# Patient Record
Sex: Male | Born: 1990 | Race: White | Hispanic: No | Marital: Married | Smoking: Current every day smoker
Health system: Southern US, Community
[De-identification: ages and names within clinical notes are randomized; demographics above are authoritative.]

## PROBLEM LIST (undated history)

## (undated) DIAGNOSIS — I10 Essential (primary) hypertension: Secondary | ICD-10-CM

---
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
## 2009-01-02 ENCOUNTER — Emergency Department: Payer: Self-pay | Admitting: Emergency Medicine

## 2009-06-10 ENCOUNTER — Emergency Department: Payer: Self-pay | Admitting: Emergency Medicine

## 2010-04-10 ENCOUNTER — Emergency Department: Payer: Self-pay | Admitting: Emergency Medicine

## 2010-07-17 ENCOUNTER — Emergency Department: Payer: Self-pay | Admitting: Emergency Medicine

## 2010-07-19 ENCOUNTER — Emergency Department: Payer: Self-pay | Admitting: Emergency Medicine

## 2010-10-17 ENCOUNTER — Emergency Department: Payer: Self-pay | Admitting: Unknown Physician Specialty

## 2010-11-04 ENCOUNTER — Emergency Department: Payer: Self-pay | Admitting: Emergency Medicine

## 2011-02-24 ENCOUNTER — Emergency Department: Payer: Self-pay | Admitting: Emergency Medicine

## 2011-05-22 ENCOUNTER — Emergency Department: Payer: Self-pay | Admitting: Emergency Medicine

## 2011-09-11 ENCOUNTER — Emergency Department: Payer: Self-pay | Admitting: Emergency Medicine

## 2011-10-06 ENCOUNTER — Emergency Department: Payer: Self-pay | Admitting: Emergency Medicine

## 2012-01-08 ENCOUNTER — Emergency Department: Payer: Self-pay | Admitting: Internal Medicine

## 2012-01-13 ENCOUNTER — Emergency Department: Payer: Self-pay | Admitting: Unknown Physician Specialty

## 2012-01-24 ENCOUNTER — Emergency Department: Payer: Self-pay | Admitting: Emergency Medicine

## 2012-01-24 LAB — BASIC METABOLIC PANEL
Anion Gap: 9 (ref 7–16)
Calcium, Total: 9.3 mg/dL (ref 8.5–10.1)
Creatinine: 0.87 mg/dL (ref 0.60–1.30)
EGFR (African American): >60
EGFR (Non-African Amer.): >60
Glucose: 134 mg/dL — ABNORMAL HIGH (ref 65–99)
Sodium: 144 mmol/L (ref 136–145)

## 2012-01-24 LAB — CBC
MCH: 31.4 pg (ref 26.0–34.0)
MCHC: 34.2 g/dL (ref 32.0–36.0)
MCV: 92 fL (ref 80–100)
Platelet: 267 10*3/uL (ref 150–440)
RDW: 12 % (ref 11.5–14.5)

## 2012-01-24 LAB — URINALYSIS, COMPLETE
Ketone: NEGATIVE
Ph: 5 (ref 4.5–8.0)
Protein: 30
RBC,UR: 611 /HPF (ref 0–5)
Squamous Epithelial: NONE SEEN
WBC UR: 3 /HPF (ref 0–5)

## 2012-10-09 ENCOUNTER — Emergency Department: Payer: Self-pay | Admitting: Emergency Medicine

## 2012-10-14 LAB — WOUND CULTURE

## 2012-11-21 ENCOUNTER — Emergency Department: Payer: Self-pay | Admitting: Emergency Medicine

## 2012-11-21 LAB — URINALYSIS, COMPLETE
Bacteria: NONE SEEN
Bilirubin,UR: NEGATIVE
Glucose,UR: NEGATIVE mg/dL (ref 0–75)
Leukocyte Esterase: NEGATIVE
Specific Gravity: 1.029 (ref 1.003–1.030)
Squamous Epithelial: NONE SEEN
WBC UR: 4 /HPF (ref 0–5)

## 2012-11-21 LAB — BASIC METABOLIC PANEL
Anion Gap: 8 (ref 7–16)
BUN: 14 mg/dL (ref 7–18)
Calcium, Total: 9.4 mg/dL (ref 8.5–10.1)
Chloride: 103 mmol/L (ref 98–107)
Co2: 26 mmol/L (ref 21–32)
Creatinine: 0.91 mg/dL (ref 0.60–1.30)
EGFR (African American): >60
Glucose: 158 mg/dL — ABNORMAL HIGH (ref 65–99)
Osmolality: 278 (ref 275–301)
Potassium: 3.4 mmol/L — ABNORMAL LOW (ref 3.5–5.1)

## 2012-11-21 LAB — CBC
HCT: 42.5 % (ref 40.0–52.0)
MCH: 33.2 pg (ref 26.0–34.0)
MCV: 94 fL (ref 80–100)
RDW: 12.8 % (ref 11.5–14.5)
WBC: 12.6 10*3/uL — ABNORMAL HIGH (ref 3.8–10.6)

## 2013-09-02 ENCOUNTER — Emergency Department: Payer: Self-pay | Admitting: Emergency Medicine

## 2013-09-02 LAB — URINALYSIS, COMPLETE
Bilirubin,UR: NEGATIVE
Glucose,UR: NEGATIVE mg/dL (ref 0–75)
Ph: 5 (ref 4.5–8.0)
RBC,UR: 10 /HPF (ref 0–5)
Specific Gravity: 1.027 (ref 1.003–1.030)
Squamous Epithelial: NONE SEEN
WBC UR: 6 /HPF (ref 0–5)

## 2013-09-02 LAB — CBC
HCT: 43.1 % (ref 40.0–52.0)
HGB: 15.2 g/dL (ref 13.0–18.0)
MCH: 32.1 pg (ref 26.0–34.0)
MCV: 91 fL (ref 80–100)
Platelet: 334 10*3/uL (ref 150–440)
RDW: 13.3 % (ref 11.5–14.5)
WBC: 7 10*3/uL (ref 3.8–10.6)

## 2013-09-02 LAB — COMPREHENSIVE METABOLIC PANEL
Albumin: 4.2 g/dL (ref 3.4–5.0)
Alkaline Phosphatase: 76 U/L (ref 50–136)
Anion Gap: 3 — ABNORMAL LOW (ref 7–16)
Calcium, Total: 9.1 mg/dL (ref 8.5–10.1)
Chloride: 115 mmol/L — ABNORMAL HIGH (ref 98–107)
Co2: 22 mmol/L (ref 21–32)
Creatinine: 1.11 mg/dL (ref 0.60–1.30)
EGFR (Non-African Amer.): >60
Glucose: 100 mg/dL — ABNORMAL HIGH (ref 65–99)
Total Protein: 7.6 g/dL (ref 6.4–8.2)

## 2014-04-15 ENCOUNTER — Ambulatory Visit: Payer: Self-pay | Admitting: Urology

## 2016-02-08 IMAGING — CT CT ABDOMEN AND PELVIS WITHOUT AND WITH CONTRAST
2 of 7 series · 13 of 46 positions shown, 18 images · IV contrast (isovue)
Comparison: CT ABD-PELV W/ CM dated 09/02/2013

CLINICAL DATA: micro hematuria, no surg, no cancer, hx of kidney
stones

EXAM:
CT ABDOMEN AND PELVIS WITHOUT AND WITH CONTRAST
TECHNIQUE: Multidetector CT imaging of the abdomen and pelvis was performed
without contrast material in one or both body regions, followed by
contrast material(s) and further sections in one or both body
regions.
CONTRAST:  130 mL Isovue 370

[Series 2: hematuria < 45 wo · axial · 0.71mm/px · z∈[-936,-561]mm · 10 of 89 slices shown, 15 images]
[im 7/89  soft-tissue]
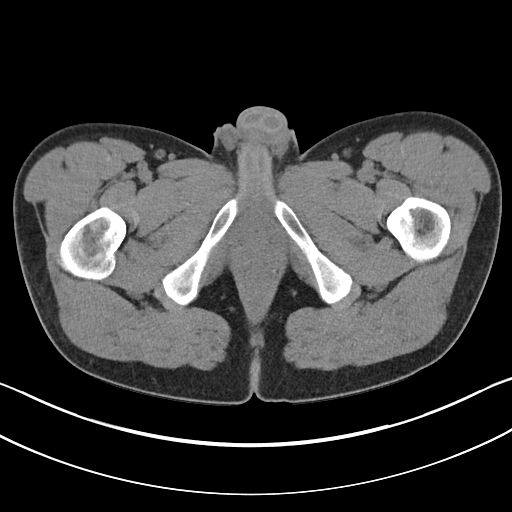
[im 7/89  bone]
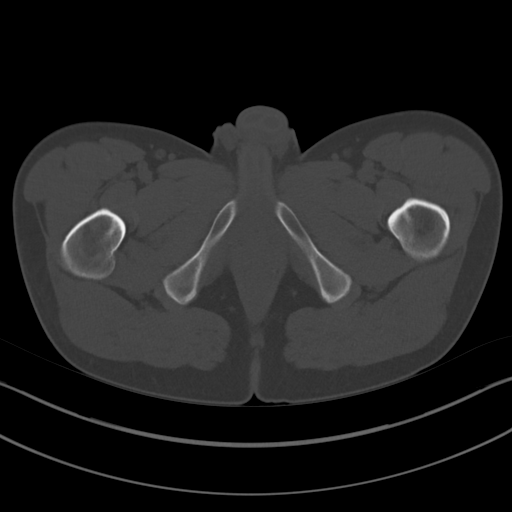
[im 21/89  soft-tissue]
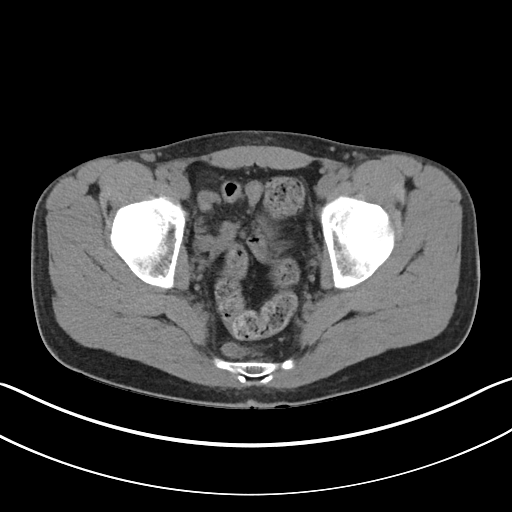
[im 28/89  soft-tissue]
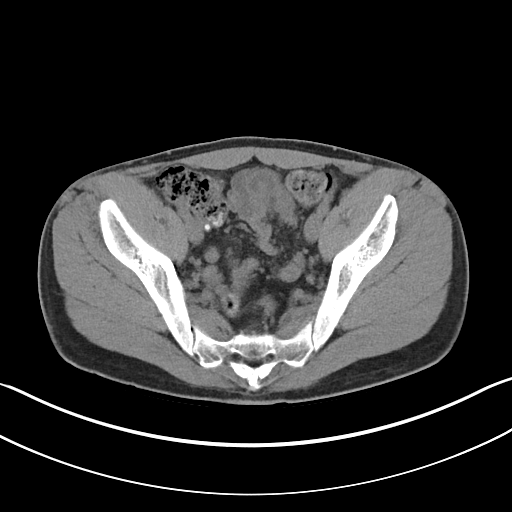
[im 34/89  soft-tissue]
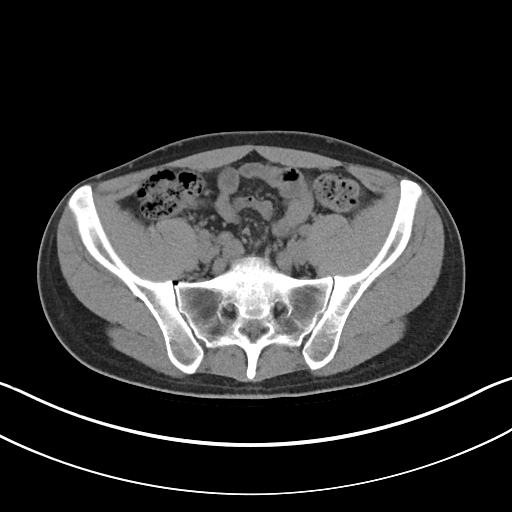
[im 48/89  soft-tissue]
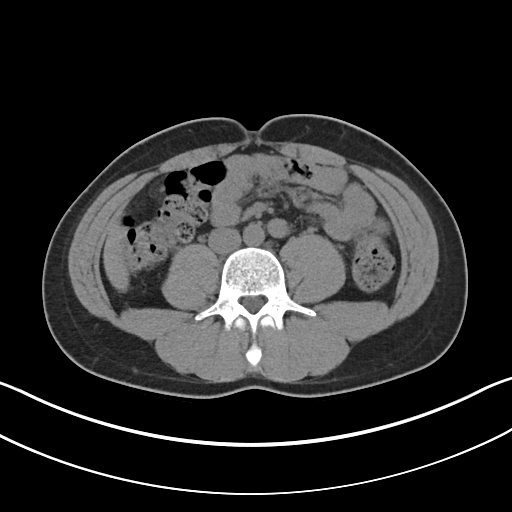
[im 55/89  soft-tissue]
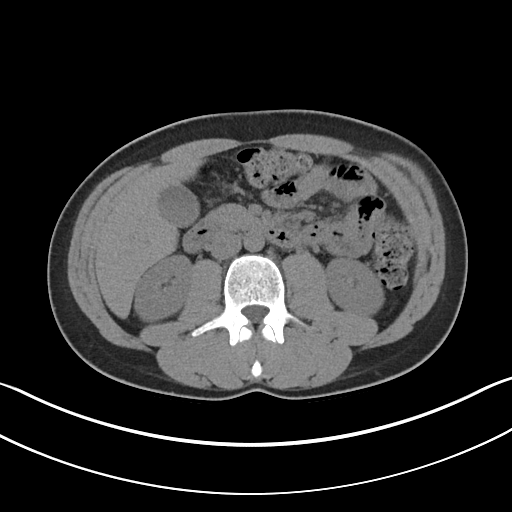
[im 61/89  soft-tissue]
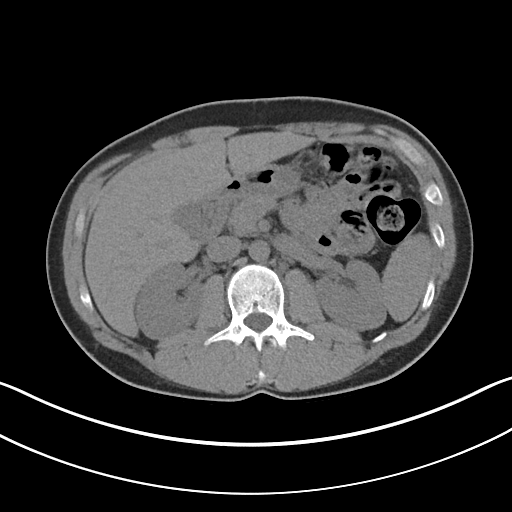
[im 61/89  lung]
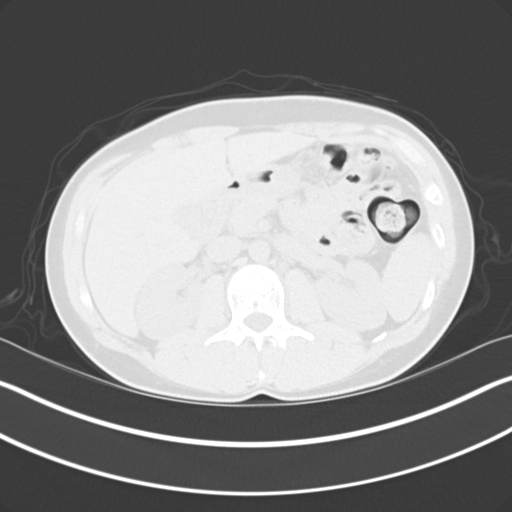
[im 68/89  lung]
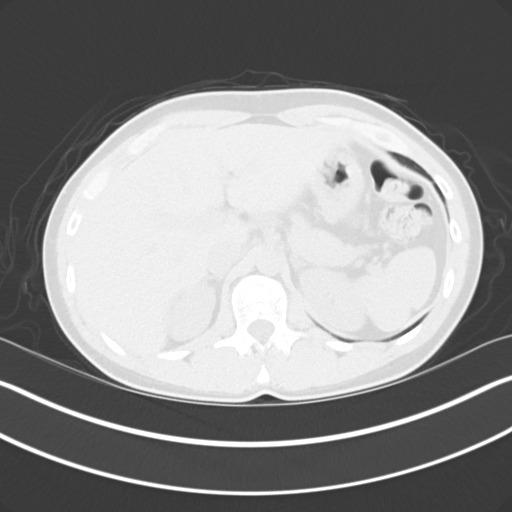
[im 75/89  soft-tissue]
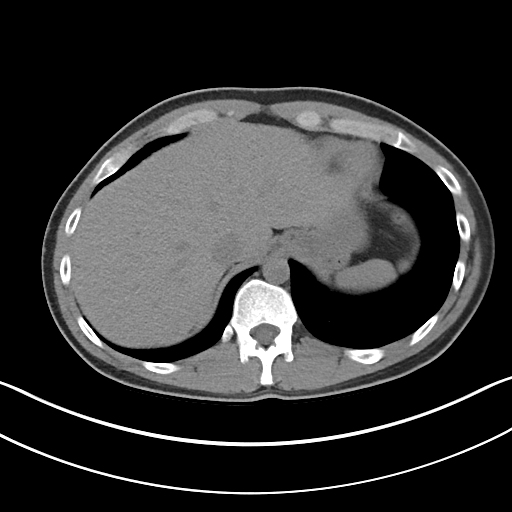
[im 75/89  lung]
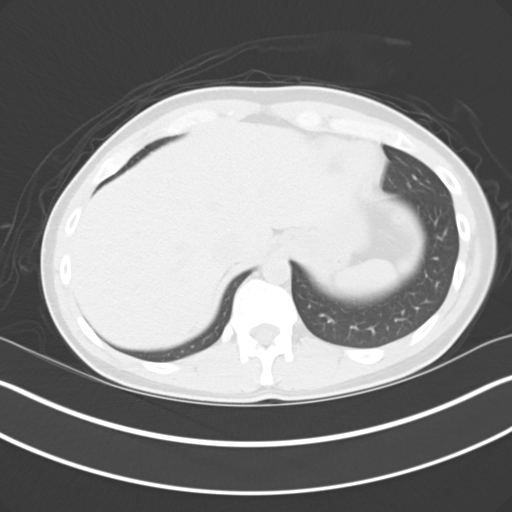
[im 82/89  soft-tissue]
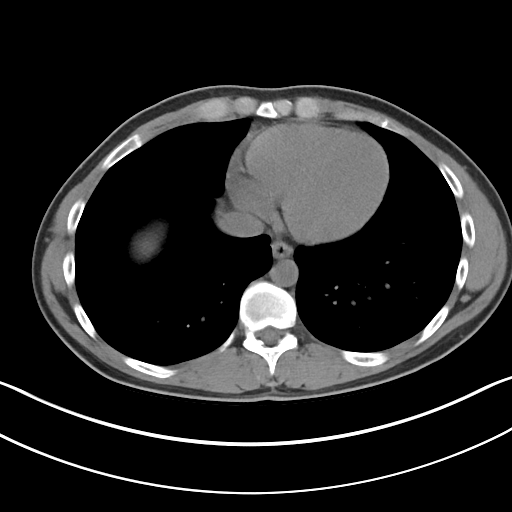
[im 82/89  lung]
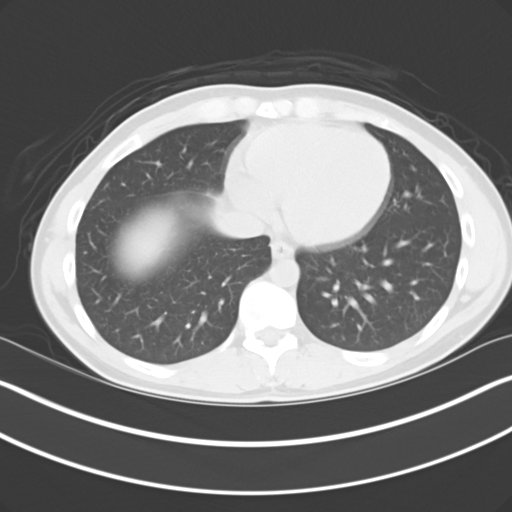
[im 82/89  bone]
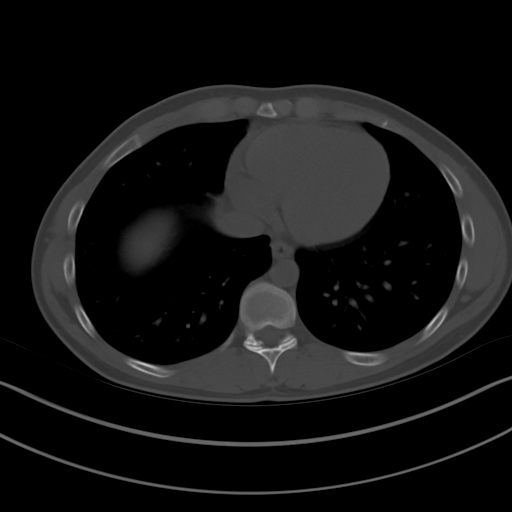

[Series 8: cor hematuria < 45 wo · coronal · 0.67mm/px · 3 of 115 slices shown]
[im 29/115  soft-tissue]
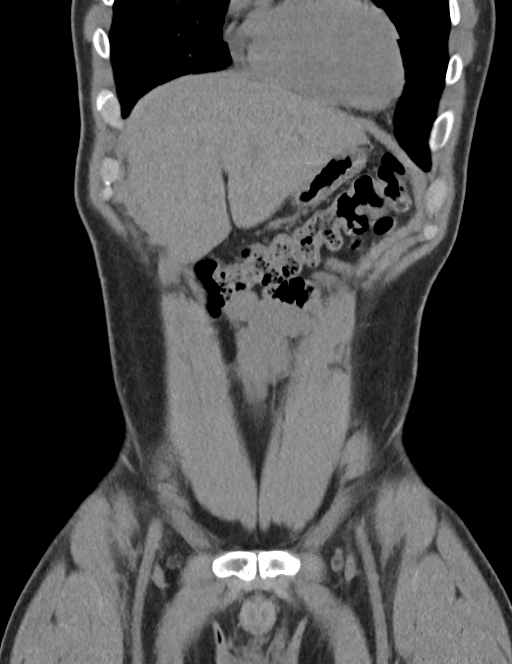
[im 58/115  soft-tissue]
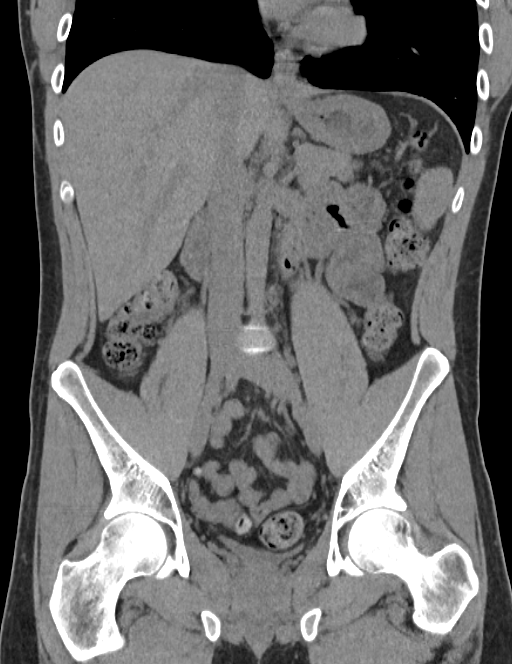
[im 86/115  soft-tissue]
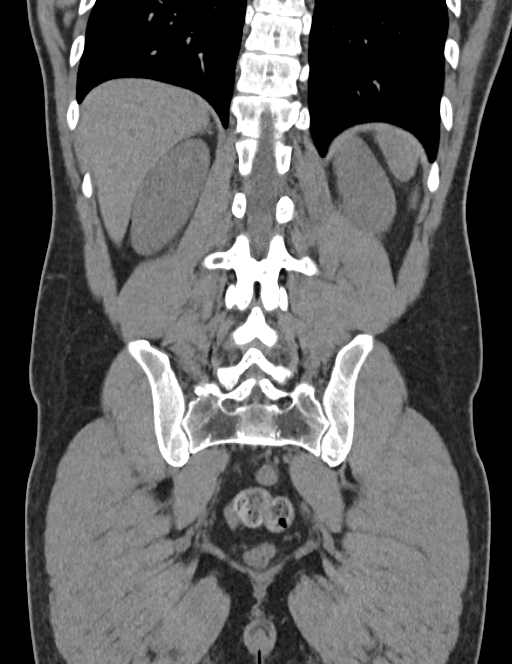

[13 of 46 positions shown; findings below may reference images not displayed]

FINDINGS: The lung bases are unremarkable.

The liver, spleen, adrenals, and pancreas unremarkable.
Nonobstructing 1 mm medullary calculi appreciated within the right
kidney and a 3 mm medullary calculus within the left. Otherwise no
evidence hydronephrosis, or masses.

There is no evidence of abdominal aortic aneurysm. The celiac, SMA,
IMA, portal vein, SMV are opacified.

A moderate to large amount of stool is appreciated within the colon.
An appendicolith is appreciated within the appendix which is
otherwise negative. No abdominal or pelvic free fluid, loculated
fluid collections, masses, nor adenopathy is appreciated.
Gallbladder and gallbladder fossa are unremarkable.

The urinary bladder is partially distended and unremarkable.

There is no evidence of abdominal wall nor inguinal hernia.

No aggressive appearing osseous lesions.
IMPRESSION: Small nonobstructing bilateral renal calculi. Otherwise no evidence
of renal masses or obstructive uropathy.

Appendicolith without evidence of appendicitis.

Moderate to large amount of fecal retention.

No further focal or acute abnormalities appreciated within the
abdomen or pelvis.

## 2016-04-16 ENCOUNTER — Encounter: Payer: Self-pay | Admitting: Emergency Medicine

## 2016-04-16 ENCOUNTER — Emergency Department
Admission: EM | Admit: 2016-04-16 | Discharge: 2016-04-16 | Disposition: A | Discharge status: Home / Self Care | Payer: BLUE CROSS/BLUE SHIELD | Attending: Emergency Medicine | Admitting: Emergency Medicine

## 2016-04-16 DIAGNOSIS — K6289 Other specified diseases of anus and rectum: Secondary | ICD-10-CM | POA: Insufficient documentation

## 2016-04-16 DIAGNOSIS — K649 Unspecified hemorrhoids: Secondary | ICD-10-CM | POA: Diagnosis present

## 2016-04-16 DIAGNOSIS — F17210 Nicotine dependence, cigarettes, uncomplicated: Secondary | ICD-10-CM | POA: Insufficient documentation

## 2016-04-16 MED ORDER — DOCUSATE SODIUM 100 MG PO CAPS: 100.0000 mg | ORAL_CAPSULE | Freq: Two times a day (BID) | ORAL | Status: AC

## 2016-04-16 MED ORDER — HYDROCORTISONE ACE-PRAMOXINE 1-1 % RE FOAM: 1.0000 | Freq: Two times a day (BID) | RECTAL | Status: DC

## 2016-04-16 MED ORDER — WITCH HAZEL-GLYCERIN EX PADS: 1.0000 "application " | MEDICATED_PAD | CUTANEOUS | Status: DC | PRN

## 2016-04-16 NOTE — ED Notes (Signed)
Pt reports having "internal hemorrhoids" that are causing him pain, pt states he has a colonoscopy scheduled for next Wednesday.

## 2016-04-16 NOTE — Discharge Instructions (Signed)
You were evaluated for rectal pain, which I suspect is due to an internal hemorrhoid. You are being prescribed several medications to help with internal hemorrhoids and the discomfort. In any case, keep your appointment with a gastroenterologist for next week.  Return to the emergency for any worsening condition including worsening pain, any abdominal pain, bleeding, drainage, fever, dizziness or passing out, or any other symptoms concerning to you.  Hemorrhoids Hemorrhoids are swollen veins around the rectum or anus. There are two types of hemorrhoids:   Internal hemorrhoids. These occur in the veins just inside the rectum. They may poke through to the outside and become irritated and painful.  External hemorrhoids. These occur in the veins outside the anus and can be felt as a painful swelling or hard lump near the anus. CAUSES  Pregnancy.   Obesity.   Constipation or diarrhea.   Straining to have a bowel movement.   Sitting for long periods on the toilet.  Heavy lifting or other activity that caused you to strain.  Anal intercourse. SYMPTOMS   Pain.   Anal itching or irritation.   Rectal bleeding.   Fecal leakage.   Anal swelling.   One or more lumps around the anus.  DIAGNOSIS  Your caregiver may be able to diagnose hemorrhoids by visual examination. Other examinations or tests that may be performed include:   Examination of the rectal area with a gloved hand (digital rectal exam).   Examination of anal canal using a small tube (scope).   A blood test if you have lost a significant amount of blood.  A test to look inside the colon (sigmoidoscopy or colonoscopy). TREATMENT Most hemorrhoids can be treated at home. However, if symptoms do not seem to be getting better or if you have a lot of rectal bleeding, your caregiver may perform a procedure to help make the hemorrhoids get smaller or remove them completely. Possible treatments include:   Placing a  rubber band at the base of the hemorrhoid to cut off the circulation (rubber band ligation).   Injecting a chemical to shrink the hemorrhoid (sclerotherapy).   Using a tool to burn the hemorrhoid (infrared light therapy).   Surgically removing the hemorrhoid (hemorrhoidectomy).   Stapling the hemorrhoid to block blood flow to the tissue (hemorrhoid stapling).  HOME CARE INSTRUCTIONS   Eat foods with fiber, such as whole grains, beans, nuts, fruits, and vegetables. Ask your doctor about taking products with added fiber in them (fibersupplements).  Increase fluid intake. Drink enough water and fluids to keep your urine clear or pale yellow.   Exercise regularly.   Go to the bathroom when you have the urge to have a bowel movement. Do not wait.   Avoid straining to have bowel movements.   Keep the anal area dry and clean. Use wet toilet paper or moist towelettes after a bowel movement.   Medicated creams and suppositories may be used or applied as directed.   Only take over-the-counter or prescription medicines as directed by your caregiver.   Take warm sitz baths for 15-20 minutes, 3-4 times a day to ease pain and discomfort.   Place ice packs on the hemorrhoids if they are tender and swollen. Using ice packs between sitz baths may be helpful.   Put ice in a plastic bag.   Place a towel between your skin and the bag.   Leave the ice on for 15-20 minutes, 3-4 times a day.   Do not use a donut-shaped pillow  or sit on the toilet for long periods. This increases blood pooling and pain.  SEEK MEDICAL CARE IF:  You have increasing pain and swelling that is not controlled by treatment or medicine.  You have uncontrolled bleeding.  You have difficulty or you are unable to have a bowel movement.  You have pain or inflammation outside the area of the hemorrhoids. MAKE SURE YOU:  Understand these instructions.  Will watch your condition.  Will get help right  away if you are not doing well or get worse.   This information is not intended to replace advice given to you by your health care provider. Make sure you discuss any questions you have with your health care provider.   Document Released: 12/10/2000 Document Revised: 11/29/2012 Document Reviewed: 10/17/2012 Elsevier Interactive Patient Education 2016 [employer]   Proctalgia Fugax Proctalgia fugax is a condition that involves very short episodes of intense pain in the rectum. The rectum is the last part of the large intestine. The pain can last from seconds to minutes. Episodes often occur during the night and awaken the person from sleep. This condition is not a sign of cancer, but your health care provider may want to rule out a number of other conditions. CAUSES The cause of this condition is not known. One possible cause may be spasm of the pelvic muscles or of the lowest part of the large intestine. SYMPTOMS The only symptom of this condition is rectal pain.  The pain may be intense or severe.  The pain may last for only a few seconds or it may last up to 30 minutes.  The pain may occur at night and wake you up from sleep. DIAGNOSIS This condition may be diagnosed by ruling out other problems that could cause the pain. Diagnosis may include:  Medical history and physical exam.  Various tests, such as:  Anoscopy. In this test, a lighted scope is put into the rectum to look for abnormalities.  Barium enema. In this test, X-rays are taken after a white chalky substance called barium is put into the colon. The barium makes it easier to see problems because it shows up well on the X-rays.  Blood tests to rule out infections or other problems. TREATMENT There is no specific treatment to cure this condition. Treatment options may include:  Medicines.  Warm baths.  Relaxation techniques.  Gentle massage of the painful area.  Biofeedback. HOME CARE INSTRUCTIONS  Take  over-the-counter and prescription medicines only as told by your health care provider.  Follow instructions from your health care provider about diet.  Follow instructions from your health care provider about rest and physical activity.  Try warm baths, massaging the area, or progressive relaxation techniques as told by your health care provider.  Keep all follow-up visits as told by your health care provider. This is important. SEEK MEDICAL CARE IF:  You develop new symptoms.  Your pain does not get better as soon as it usually does.   This information is not intended to replace advice given to you by your health care provider. Make sure you discuss any questions you have with your health care provider.   Document Released: 09/07/2001 Document Revised: 09/03/2015 Document Reviewed: 03/10/2015 Elsevier Interactive Patient Education [employer]

## 2016-04-16 NOTE — ED Notes (Signed)
Patient ambulatory to triage with steady gait, without difficulty or distress noted; pt reports hemorrhoidal pain; using OTC meds without relief

## 2016-04-16 NOTE — ED Provider Notes (Addendum)
[facility] Emergency Department Provider Note   ____________________________________________  Time seen: Approximately 7:30 AM I have reviewed the triage vital signs and the triage nursing note.  HISTORY  Chief Complaint Hemorrhoids   Historian Patient  HPI Dustin Frank is a 25 y.o. male who is here for evaluation of rectal pain, has been ongoing for about 2 weeks. It's worse with bowel movements. He has an appointment with the gastrologist for this coming Wednesday, less than one week, for colonoscopy and evaluation.  He's been taking ibuprofen, and it is not really managing the pain. He has tried heating pad which hasn't helped much, but he did try ice pack and that helped a little bit more.  Symptoms are mild to moderate. No rectal bleeding. No pus drainage.   No fevers, no systemic symptoms.    History reviewed. No pertinent past medical history.  There are no active problems to display for this patient.   History reviewed. No pertinent past surgical history.  Current Outpatient Rx  Name  Route  Sig  Dispense  Refill  . docusate sodium (COLACE) 100 MG capsule   Oral   Take 1 capsule (100 mg total) by mouth 2 (two) times daily.   60 capsule   2   . hydrocortisone-pramoxine (PROCTOFOAM HC) rectal foam   Rectal   Place 1 applicator rectally 2 (two) times daily.   10 g   0   . witch hazel-glycerin (TUCKS) pad   Topical   Apply 1 application topically as needed for itching.   40 each   0     Allergies Sulfa antibiotics  No family history on file.  Social History Social History  Substance Use Topics  . Smoking status: Current Every Day Smoker -- 1.00 packs/day    Types: Cigarettes  . Smokeless tobacco: None  . Alcohol Use: No    Review of Systems  Constitutional: Negative for fever. Eyes: Negative for visual changes. ENT: Negative for sore throat. Cardiovascular: Negative for chest pain. Respiratory: Negative for  shortness of breath. Gastrointestinal: Negative for abdominal pain, vomiting and diarrhea. Genitourinary: Negative for dysuria. Musculoskeletal: Negative for back pain. Skin: Negative for rash. Neurological: Negative for headache. 10 point Review of Systems otherwise negative ____________________________________________   PHYSICAL EXAM:  VITAL SIGNS: ED Triage Vitals  Enc Vitals Group     BP 04/16/16 0631 157/111 mmHg     Pulse Rate 04/16/16 0631 73     Resp 04/16/16 0631 20     Temp 04/16/16 0631 97.9 F (36.6 C)     Temp Source 04/16/16 0631 Oral     SpO2 04/16/16 0631 99 %     Weight 04/16/16 0631 140 lb (63.504 kg)     Height 04/16/16 0631 5\' 5"  (1.651 m)     Head Cir --      Peak Flow --      Pain Score 04/16/16 0631 10     Pain Loc --      Pain Edu? --      Excl. in GC? --      Constitutional: Alert and oriented. Well appearing and in no distress. HEENT   Head: Normocephalic and atraumatic.      Eyes: Conjunctivae are normal. PERRL. Normal extraocular movements.      Ears:         Nose: No congestion/rhinnorhea.   Mouth/Throat: Mucous membranes are moist.   Neck: No stridor. Cardiovascular/Chest: Normal rate, regular rhythm.  No murmurs, rubs,  or gallops. Respiratory: Normal respiratory effort without tachypnea nor retractions. Breath sounds are clear and equal bilaterally. No wheezes/rales/rhonchi. Gastrointestinal: Soft. No distention, no guarding, no rebound. Nontender.    Genitourinary/rectal:No external hemorrhoids, tender rectal exam with firm area just inside the ridge. Musculoskeletal: Nontender with normal range of motion in all extremities. No joint effusions.  No lower extremity tenderness.  No edema. Neurologic:  Normal speech and language. No gross or focal neurologic deficits are appreciated. Skin:  Skin is warm, dry and intact. No rash noted.Multiple tattoos on external nares. Psychiatric: Mood and affect are normal. Speech and behavior  are normal. Patient exhibits appropriate insight and judgment.  ____________________________________________   EKG I, Governor Rooks, MD, the attending physician have personally viewed and interpreted all ECGs.  None ____________________________________________  LABS (pertinent positives/negatives)  None  ____________________________________________  RADIOLOGY All Xrays were viewed by me. Imaging interpreted by Radiologist.  None __________________________________________  PROCEDURES  Procedure(s) performed: None  Critical Care performed: None  ____________________________________________   ED COURSE / ASSESSMENT AND PLAN  Pertinent labs & imaging results that were available during my care of the patient were reviewed by me and considered in my medical decision making (see chart for details).   This patient had several weeks now of rectal pain, which most likely is an internal hemorrhoid. No symptoms of fistula or perirectal abscess. I discussed with the patient that if he has fever or any worsening pain or drainage, he should definitely come back to get rechecked for this. We discussed obtaining blood were typed check a white blood cell count, but in the absence of other risk factors, chose to hold off on this now, especially since he has a GI appointment with colonoscopy this coming Wednesday.  He will be treated with symptomatic medications for hemorrhoids. Discussed high fiber diet.  Patient has a symptomatic hypertension, and is pain level is still about a 7 out of 10, and I suspect this is being driven by pain. I discussed with him holding off on any specific blood pressure pill, until he has been rechecked when he is not in pain. He does have a follow-up appointment for next Wednesday, where he will be to get his blood pressure checked.   CONSULTATIONS:   None   Patient / Family / Caregiver informed of clinical course, medical decision-making process, and agree  with plan.   I discussed return precautions, follow-up instructions, and discharged instructions with patient and/or family.   ___________________________________________   FINAL CLINICAL IMPRESSION(S) / ED DIAGNOSES   Final diagnoses:  Rectal pain              Note: This dictation was prepared with Dragon dictation. Any transcriptional errors that result from this process are unintentional   Governor Rooks, MD 04/16/16 5409  Governor Rooks, MD 04/16/16 [phone]

## 2016-04-16 NOTE — ED Notes (Signed)
Dr. Shaune PollackLord was notified of increased blood pressure on discharge.  Dr. Shaune PollackLord in to speak with patient regarding rationale for NOT putting him on antihypertensives and that the patient should treat pain as previously discussed in discharge instructions and then see primary care physician for recheck of BP.

## 2017-05-10 ENCOUNTER — Encounter: Payer: Self-pay | Admitting: *Deleted

## 2017-05-10 ENCOUNTER — Ambulatory Visit
Admission: EM | Admit: 2017-05-10 | Discharge: 2017-05-10 | Disposition: A | Discharge status: Home / Self Care | Payer: BLUE CROSS/BLUE SHIELD | Attending: Family Medicine | Admitting: Family Medicine

## 2017-05-10 DIAGNOSIS — K529 Noninfective gastroenteritis and colitis, unspecified: Secondary | ICD-10-CM

## 2017-05-10 LAB — CBC WITH DIFFERENTIAL/PLATELET
BASOS ABS: 0.1 10*3/uL (ref 0–0.1)
BASOS PCT: 1 %
Eosinophils Absolute: 0.2 10*3/uL (ref 0–0.7)
Eosinophils Relative: 4 %
HEMATOCRIT: 43.1 % (ref 40.0–52.0)
HEMOGLOBIN: 14.7 g/dL (ref 13.0–18.0)
Lymphocytes Relative: 33 %
Lymphs Abs: 1.6 10*3/uL (ref 1.0–3.6)
MCH: 31.3 pg (ref 26.0–34.0)
MCHC: 34.1 g/dL (ref 32.0–36.0)
MCV: 92 fL (ref 80.0–100.0)
MONOS PCT: 14 %
Monocytes Absolute: 0.7 10*3/uL (ref 0.2–1.0)
NEUTROS ABS: 2.5 10*3/uL (ref 1.4–6.5)
NEUTROS PCT: 48 %
Platelets: 280 10*3/uL (ref 150–440)
RBC: 4.69 MIL/uL (ref 4.40–5.90)
RDW: 13.8 % (ref 11.5–14.5)
WBC: 5.1 10*3/uL (ref 3.8–10.6)

## 2017-05-10 LAB — COMPREHENSIVE METABOLIC PANEL
ALBUMIN: 4.1 g/dL (ref 3.5–5.0)
ALK PHOS: 102 U/L (ref 38–126)
ALT: 57 U/L (ref 17–63)
AST: 61 U/L — AB (ref 15–41)
Anion gap: 7 (ref 5–15)
BUN: 12 mg/dL (ref 6–20)
CALCIUM: 8.9 mg/dL (ref 8.9–10.3)
CO2: 21 mmol/L — AB (ref 22–32)
CREATININE: 1.02 mg/dL (ref 0.61–1.24)
Chloride: 107 mmol/L (ref 101–111)
GFR calc Af Amer: >60 mL/min (ref >60)
GFR calc non Af Amer: >60 mL/min (ref >60)
GLUCOSE: 97 mg/dL (ref 65–99)
Potassium: 3.8 mmol/L (ref 3.5–5.1)
Sodium: 135 mmol/L (ref 135–145)
Total Bilirubin: 0.2 mg/dL — ABNORMAL LOW (ref 0.3–1.2)
Total Protein: 7.6 g/dL (ref 6.5–8.1)

## 2017-05-10 MED ORDER — ONDANSETRON 8 MG PO TBDP: 8.0000 mg | ORAL_TABLET | Freq: Three times a day (TID) | ORAL | 0 refills | Status: DC | PRN

## 2017-05-10 MED ORDER — CIPROFLOXACIN HCL 500 MG PO TABS: 500.0000 mg | ORAL_TABLET | Freq: Two times a day (BID) | ORAL | 0 refills | Status: DC

## 2017-05-10 NOTE — Discharge Instructions (Signed)
Imodium AD over the counter 

## 2017-05-10 NOTE — ED Provider Notes (Signed)
[facility]    CSN: [id number] Arrival date & time: 05/10/17  0948     History   Chief Complaint Chief Complaint  Patient presents with  . Emesis  . Diarrhea  . Abdominal Pain    HPI Dustin Frank is a 26 y.o. male.   The history is provided by the patient.  Emesis  Severity:  Moderate Duration:  3 days Timing:  Intermittent Number of daily episodes:  Has not vomited since yesterday; yesterday vomited once Quality:  Stomach contents Able to tolerate:  Liquids Progression:  Partially resolved Chronicity:  New Recent urination:  Normal Associated symptoms: abdominal pain (mild, diffuse) and diarrhea (watery; denies blood)   Associated symptoms: no chills and no fever   Risk factors: no alcohol use, no diabetes, not pregnant, no prior abdominal surgery, no suspect food intake and no travel to endemic areas  Sick contacts: unknown.   Diarrhea  Associated symptoms: abdominal pain (mild, diffuse) and vomiting   Associated symptoms: no chills and no fever   Abdominal Pain  Associated symptoms: diarrhea (watery; denies blood) and vomiting   Associated symptoms: no chills and no fever     History reviewed. No pertinent past medical history.  There are no active problems to display for this patient.   History reviewed. No pertinent surgical history.     Home Medications    Prior to Admission medications   Medication Sig Start Date End Date Taking? Authorizing Provider  ciprofloxacin (CIPRO) 500 MG tablet Take 1 tablet (500 mg total) by mouth every 12 (twelve) hours. 05/10/17   Payton Mccallum, MD  hydrocortisone-pramoxine (PROCTOFOAM [facility]) rectal foam Place 1 applicator rectally 2 (two) times daily. 04/16/16   Governor Rooks, MD  ondansetron (ZOFRAN ODT) 8 MG disintegrating tablet Take 1 tablet (8 mg total) by mouth every 8 (eight) hours as needed. 05/10/17   Payton Mccallum, MD  witch hazel-glycerin (TUCKS) pad Apply 1 application topically as needed for  itching. 04/16/16   Governor Rooks, MD    Family History History reviewed. No pertinent family history.  Social History Social History  Substance Use Topics  . Smoking status: Current Every Day Smoker    Packs/day: 1.00    Types: Cigarettes  . Smokeless tobacco: Never Used  . Alcohol use No     Allergies   Sulfa antibiotics   Review of Systems Review of Systems  Constitutional: Negative for chills and fever.  Gastrointestinal: Positive for abdominal pain (mild, diffuse), diarrhea (watery; denies blood) and vomiting.     Physical Exam Triage Vital Signs ED Triage Vitals  Enc Vitals Group     BP 05/10/17 0959 (!) 138/91     Pulse Rate 05/10/17 0959 87     Resp 05/10/17 0959 16     Temp 05/10/17 0959 98.6 F (37 C)     Temp Source 05/10/17 0959 Oral     SpO2 05/10/17 0959 99 %     Weight 05/10/17 1000 164 lb (74.4 kg)     Height 05/10/17 1000 5\' 4"  (1.626 m)     Head Circumference --      Peak Flow --      Pain Score 05/10/17 1001 2     Pain Loc --      Pain Edu? --      Excl. in GC? --    No data found.   Updated Vital Signs BP (!) 138/91 (BP Location: Right Arm)   Pulse 87   Temp 98.6  F (37 C) (Oral)   Resp 16   Ht 5\' 4"  (1.626 m)   Wt 164 lb (74.4 kg)   SpO2 99%   BMI 28.15 kg/m   Visual Acuity Right Eye Distance:   Left Eye Distance:   Bilateral Distance:    Right Eye Near:   Left Eye Near:    Bilateral Near:     Physical Exam  Constitutional: He is oriented to person, place, and time. He appears well-developed and well-nourished. No distress.  HENT:  Head: Normocephalic and atraumatic.  Cardiovascular: Normal rate, regular rhythm, normal heart sounds and intact distal pulses.   No murmur heard. Pulmonary/Chest: Effort normal and breath sounds normal. No respiratory distress. He has no wheezes. He has no rales.  Abdominal: Soft. Bowel sounds are normal. He exhibits no distension and no mass. There is tenderness (mild, diffuse; no rebound  or guarding). There is no rebound and no guarding.  Neurological: He is alert and oriented to person, place, and time.  Skin: No rash noted. He is not diaphoretic.  Nursing note and vitals reviewed.    UC Treatments / Results  Labs (all labs ordered are listed, but only abnormal results are displayed) Labs Reviewed  COMPREHENSIVE METABOLIC PANEL - Abnormal; Notable for the following:       Result Value   CO2 21 (*)    AST 61 (*)    Total Bilirubin 0.2 (*)    All other components within normal limits  GASTROINTESTINAL PANEL BY PCR, STOOL (REPLACES STOOL CULTURE)  CBC WITH DIFFERENTIAL/PLATELET    EKG  EKG Interpretation None       Radiology No results found.  Procedures Procedures (including critical care time)  Medications Ordered in UC Medications - No data to display   Initial Impression / Assessment and Plan / UC Course  I have reviewed the triage vital signs and the nursing notes.  Pertinent labs & imaging results that were available during my care of the patient were reviewed by me and considered in my medical decision making (see chart for details).       Final Clinical Impressions(s) / UC Diagnoses   Final diagnoses:  Gastroenteritis    New Prescriptions Discharge Medication List as of 05/10/2017 11:17 AM    START taking these medications   Details  ciprofloxacin (CIPRO) 500 MG tablet Take 1 tablet (500 mg total) by mouth every 12 (twelve) hours., Starting Tue 05/10/2017, Normal    ondansetron (ZOFRAN ODT) 8 MG disintegrating tablet Take 1 tablet (8 mg total) by mouth every 8 (eight) hours as needed., Starting Tue 05/10/2017, Normal       1. Lab results and diagnosis reviewed with patient 2. rx as per orders above; reviewed possible side effects, interactions, risks and benefits  3. Recommend supportive treatment with clear liquids/increased fluids, then advance diet slowly as tolerated; otc imodium  4. Follow-up prn if symptoms worsen or don't  improve   Payton Mccallumonty, [place], MD 05/10/17 1315

## 2017-05-10 NOTE — ED Triage Notes (Signed)
Patient started having symptoms of nausea, vomiting, and diarrhea 4 days ago. Symptom of diarrhea has persisted.

## 2019-11-17 ENCOUNTER — Other Ambulatory Visit: Payer: Self-pay

## 2019-11-17 DIAGNOSIS — Z20822 Contact with and (suspected) exposure to covid-19: Secondary | ICD-10-CM

## 2019-11-19 LAB — NOVEL CORONAVIRUS, NAA: SARS-CoV-2, NAA: NOT DETECTED

## 2020-03-18 ENCOUNTER — Other Ambulatory Visit: Payer: Self-pay

## 2020-03-18 ENCOUNTER — Ambulatory Visit: Payer: BC Managed Care – PPO | Admitting: Podiatry

## 2020-03-18 ENCOUNTER — Encounter: Payer: Self-pay | Admitting: Podiatry

## 2020-03-18 ENCOUNTER — Telehealth: Payer: Self-pay | Admitting: *Deleted

## 2020-03-18 ENCOUNTER — Ambulatory Visit (INDEPENDENT_AMBULATORY_CARE_PROVIDER_SITE_OTHER): Payer: BC Managed Care – PPO

## 2020-03-18 DIAGNOSIS — M7661 Achilles tendinitis, right leg: Secondary | ICD-10-CM

## 2020-03-18 DIAGNOSIS — M79671 Pain in right foot: Secondary | ICD-10-CM

## 2020-03-18 MED ORDER — MELOXICAM 7.5 MG PO TABS: 7.5000 mg | ORAL_TABLET | Freq: Every day | ORAL | 0 refills | Status: DC

## 2020-03-18 NOTE — Progress Notes (Signed)
Subjective:  Patient ID: Dustin Frank, male    DOB: [birth year],  MRN: [id number]  Chief Complaint  Patient presents with  . Tendonitis    pt has foot pain to the back of the right heel, pain has been going on for 2-3 months, pt states pain is elevated when he is walking on it, pt puts pain as a 9 out of 10 on the pain scale.    29 y.o. male presents with the above complaint.  Patient presents with continuous heel pain to the back of it.  Has been going for 2 to 3 months.  It has started acutely but then gradually got worse with time.  Painful when walking on it.  Patient states is tried things to relieve pressure and start walking on his other foot side of the foot and is causing him pain to that area because of compensation.  He states that his causing him more pain now.  He would like to know if there is anything that could be done to address this.  His pain scale is 9 out of 10.  He denies any other acute complaints.  He is constantly working on his feet with concrete flooring.   Review of Systems: Negative except as noted in the HPI. Denies N/V/F/Ch.  No past medical history on file.  Current Outpatient Medications:  .  atorvastatin (LIPITOR) 20 MG tablet, Take 20 mg by mouth daily., Disp: , Rfl:  .  ciprofloxacin (CIPRO) 500 MG tablet, Take 1 tablet (500 mg total) by mouth every 12 (twelve) hours., Disp: 6 tablet, Rfl: 0 .  HYDROcodone-acetaminophen (NORCO/VICODIN) 5-325 MG tablet, Take by mouth., Disp: , Rfl:  .  hydrocortisone-pramoxine (PROCTOFOAM HC) rectal foam, Place 1 applicator rectally 2 (two) times daily., Disp: 10 g, Rfl: 0 .  lisinopril (ZESTRIL) 10 MG tablet, Take 10 mg by mouth daily., Disp: , Rfl:  .  naproxen sodium (ALEVE) 220 MG tablet, Take by mouth., Disp: , Rfl:  .  omeprazole (PRILOSEC) 20 MG capsule, Take by mouth., Disp: , Rfl:  .  omeprazole (PRILOSEC) 40 MG capsule, Take 40 mg by mouth 2 (two) times daily., Disp: , Rfl:  .  ondansetron (ZOFRAN ODT) 8 MG  disintegrating tablet, Take 1 tablet (8 mg total) by mouth every 8 (eight) hours as needed., Disp: 6 tablet, Rfl: 0 .  witch hazel-glycerin (TUCKS) pad, Apply 1 application topically as needed for itching., Disp: 40 each, Rfl: 0 .  meloxicam (MOBIC) 7.5 MG tablet, Take 1 tablet (7.5 mg total) by mouth daily., Disp: 30 tablet, Rfl: 0  Social History   Tobacco Use  Smoking Status Current Every Day Smoker  . Packs/day: 1.00  . Types: Cigarettes  Smokeless Tobacco Never Used    Allergies  Allergen Reactions  . Sulfa Antibiotics Rash   Objective:  There were no vitals filed for this visit. There is no height or weight on file to calculate BMI. Constitutional Well developed. Well nourished.  Vascular Dorsalis pedis pulses palpable bilaterally. Posterior tibial pulses palpable bilaterally. Capillary refill normal to all digits.  No cyanosis or clubbing noted. Pedal hair growth normal.  Neurologic Normal speech. Oriented to person, place, and time. Epicritic sensation to light touch grossly present bilaterally.  Dermatologic Nails well groomed and normal in appearance. No open wounds. No skin lesions.  Orthopedic:  Pain on palpation to the right Achilles tendon insertion.  Pain with dorsiflexion at active and passive.  No pain with plantarflexion of the foot.  Mild pain with inversion and eversion active and passive.  No pain at the ATFL, peroneal, posterior tibial tendon.   Radiographs: 3 views of skeletally mature adult foot: Mild arthritic changes noted at the talonavicular joint as well as midfoot.  There is elevatus presence of the first metatarsal.  Good calcaneal inclination angle.  Mild arthritic changes at the ankle joint.  No other bony abnormalities noted. Assessment:   1. Achilles tendinitis of right lower extremity    Plan:  Patient was evaluated and treated and all questions answered.  Right Achilles insertional tendinitis -I explained to the patient the etiology of  Achilles tendinitis and various treatment options were discussed.  Ultimately this has to do with the foot architecture of pes planus deformity putting excessive stress to the Achilles tendon.  I believe patient will benefit from a steroid injection given clinically he has huge amount of pain associated with it.  I explained to the patient that I like to inject in the Cager's fat pad and not within the tendon due to high risk of rupture.  Patient states understanding would like to proceed with injection. -I also believe patient will benefit from acute immobilization for next 4 weeks to allow for proper decrease in inflammation. -He will be placed in a cam boot. -I will also discuss orthotic management during next visit -A steroid injection was performed at Kager's fat pad using 1% plain Lidocaine and 10 mg of Kenalog. This was well tolerated.   No follow-ups on file.

## 2020-03-18 NOTE — Telephone Encounter (Signed)
Pt states his medication is not at the pharmacy.

## 2020-03-18 NOTE — Telephone Encounter (Signed)
I spoke with pt and informed that the medication had been called to the [facility] at 9:23am this morning.

## 2024-06-16 ENCOUNTER — Encounter: Payer: Self-pay | Admitting: Emergency Medicine

## 2024-06-16 ENCOUNTER — Ambulatory Visit
Admission: EM | Admit: 2024-06-16 | Discharge: 2024-06-16 | Disposition: A | Discharge status: Home / Self Care | Attending: Physician Assistant | Admitting: Physician Assistant

## 2024-06-16 DIAGNOSIS — I10 Essential (primary) hypertension: Secondary | ICD-10-CM

## 2024-06-16 DIAGNOSIS — Z760 Encounter for issue of repeat prescription: Secondary | ICD-10-CM | POA: Diagnosis not present

## 2024-06-16 MED ORDER — LISINOPRIL 20 MG PO TABS: 20.0000 mg | ORAL_TABLET | Freq: Every day | ORAL | 2 refills | Status: DC

## 2024-06-16 NOTE — ED Triage Notes (Addendum)
 Pt presents to [facility] for medication refill on HTN medication. (Lisinopril 20mg ). He states he was getting his rx through his job and he changed jobs and needs to find a PCP. He states he took his last dose this morning.

## 2024-06-16 NOTE — ED Provider Notes (Signed)
 [facility]    CSN: [id number] Arrival date & time: 06/16/24  1127      History   Chief Complaint Chief Complaint  Patient presents with   Medication Refill    HPI Dustin Frank is a 33 y.o. male presenting for refill of lisinopril 20 mg tablets that he takes for hypertension.  Does not currently have a PCP.  Has been on this medication for years and has tolerated well.  Denies any side effects.  Denies any chest pain, headaches, dizziness, shortness of breath, palpitations, leg swelling or weakness.  States that his wife is trying to find him a PCP at this time but he does not want to run out of his medication.  HPI  No past medical history on file.  There are no active problems to display for this patient.   No past surgical history on file.     Home Medications    Prior to Admission medications   Medication Sig Start Date End Date Taking? Authorizing Provider  lisinopril (ZESTRIL) 20 MG tablet Take 1 tablet (20 mg total) by mouth daily. 06/16/24 07/16/24 Yes Arvis Jolan NOVAK, PA-C  omeprazole (PRILOSEC) 20 MG capsule Take by mouth.   Yes [provider]  atorvastatin (LIPITOR) 20 MG tablet Take 20 mg by mouth daily. 01/15/20   [provider]  HYDROcodone-acetaminophen (NORCO/VICODIN) 5-325 MG tablet Take by mouth. 06/21/16   [provider]  witch hazel-glycerin  (TUCKS) pad Apply 1 application topically as needed for itching. 04/16/16   Jacquetta Stabs, MD    Family History No family history on file.  Social History Social History   Tobacco Use   Smoking status: Every Day    Current packs/day: 1.00    Types: Cigarettes   Smokeless tobacco: Never  Vaping Use   Vaping status: Never Used  Substance Use Topics   Alcohol use: No   Drug use: No     Allergies   Sulfa antibiotics   Review of Systems Review of Systems  Constitutional:  Negative for fatigue.  Respiratory:  Negative for shortness of breath.    Cardiovascular:  Negative for chest pain, palpitations and leg swelling.  Neurological:  Negative for dizziness, weakness and headaches.     Physical Exam Triage Vital Signs ED Triage Vitals  Encounter Vitals Group     BP 06/16/24 1142 (!) 144/103     Girls Systolic BP Percentile --      Girls Diastolic BP Percentile --      Boys Systolic BP Percentile --      Boys Diastolic BP Percentile --      Pulse Rate 06/16/24 1142 92     Resp 06/16/24 1142 16     Temp 06/16/24 1142 98 F (36.7 C)     Temp Source 06/16/24 1142 Oral     SpO2 06/16/24 1142 99 %     Weight 06/16/24 1140 164 lb 0.4 oz (74.4 kg)     Height 06/16/24 1140 5' 4 (1.626 m)     Head Circumference --      Peak Flow --      Pain Score 06/16/24 1140 0     Pain Loc --      Pain Education --      Exclude from Growth Chart --    No data found.  Updated Vital Signs BP (!) 139/102 (BP Location: Left Arm)   Pulse 92   Temp 98 F (36.7 C) (Oral)   Resp  16   Ht 5' 4 (1.626 m)   Wt 164 lb 0.4 oz (74.4 kg)   SpO2 99%   BMI 28.15 kg/m       Physical Exam Vitals and nursing note reviewed.  Constitutional:      General: He is not in acute distress.    Appearance: Normal appearance. He is well-developed. He is not ill-appearing.  HENT:     Head: Normocephalic and atraumatic.   Eyes:     General: No scleral icterus.    Conjunctiva/sclera: Conjunctivae normal.    Cardiovascular:     Rate and Rhythm: Normal rate and regular rhythm.  Pulmonary:     Effort: Pulmonary effort is normal. No respiratory distress.     Breath sounds: Normal breath sounds.   Musculoskeletal:     Cervical back: Neck supple.   Skin:    General: Skin is warm and dry.     Capillary Refill: Capillary refill takes less than 2 seconds.   Neurological:     General: No focal deficit present.     Mental Status: He is alert. Mental status is at baseline.     Motor: No weakness.     Gait: Gait normal.   Psychiatric:        Mood  and Affect: Mood normal.        Behavior: Behavior normal.      UC Treatments / Results  Labs (all labs ordered are listed, but only abnormal results are displayed) Labs Reviewed - No data to display  EKG   Radiology No results found.  Procedures Procedures (including critical care time)  Medications Ordered in UC Medications - No data to display  Initial Impression / Assessment and Plan / UC Course  I have reviewed the triage vital signs and the nursing notes.  Pertinent labs & imaging results that were available during my care of the patient were reviewed by me and considered in my medical decision making (see chart for details).   33 year old male with history of hypertension presents for medication refill.  Not currently established with PCP but is looking.  Blood pressure is 139/102. Recheck is 144/103.  Chest clear.  Heart regular rate and rhythm.  No leg swelling.  Refilled lisinopril.  Advised to continue looking for PCP.  61-month supply of lisinopril given.   Final Clinical Impressions(s) / UC Diagnoses   Final diagnoses:  Primary hypertension  Medication refill   Discharge Instructions   None    ED Prescriptions     Medication Sig Dispense Auth. Provider   lisinopril (ZESTRIL) 20 MG tablet Take 1 tablet (20 mg total) by mouth daily. 30 tablet Elyssia Strausser B, PA-C      PDMP not reviewed this encounter.   Arvis Jolan NOVAK, PA-C 06/16/24 1218

## 2024-10-20 ENCOUNTER — Ambulatory Visit
Admission: EM | Admit: 2024-10-20 | Discharge: 2024-10-20 | Disposition: A | Discharge status: Home / Self Care | Attending: Family Medicine | Admitting: Family Medicine

## 2024-10-20 ENCOUNTER — Encounter: Payer: Self-pay | Admitting: Emergency Medicine

## 2024-10-20 DIAGNOSIS — I10 Essential (primary) hypertension: Secondary | ICD-10-CM

## 2024-10-20 DIAGNOSIS — R0789 Other chest pain: Secondary | ICD-10-CM | POA: Diagnosis not present

## 2024-10-20 HISTORY — DX: Essential (primary) hypertension: I10

## 2024-10-20 MED ORDER — LISINOPRIL 20 MG PO TABS: 20.0000 mg | ORAL_TABLET | Freq: Every day | ORAL | 2 refills | Status: AC

## 2024-10-20 MED ORDER — MELOXICAM 15 MG PO TABS: 15.0000 mg | ORAL_TABLET | Freq: Every day | ORAL | 0 refills | Status: AC | PRN

## 2024-10-20 MED ORDER — BACLOFEN 10 MG PO TABS: 5.0000 mg | ORAL_TABLET | Freq: Three times a day (TID) | ORAL | 0 refills | Status: DC
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
## 2025-02-01 ENCOUNTER — Ambulatory Visit
Admission: EM | Admit: 2025-02-01 | Discharge: 2025-02-01 | Disposition: A | Discharge status: Home / Self Care | Source: Home / Self Care | Attending: Family Medicine | Admitting: Family Medicine

## 2025-02-01 ENCOUNTER — Encounter: Payer: Self-pay | Admitting: Emergency Medicine

## 2025-02-01 DIAGNOSIS — Z20828 Contact with and (suspected) exposure to other viral communicable diseases: Secondary | ICD-10-CM

## 2025-02-01 DIAGNOSIS — J111 Influenza due to unidentified influenza virus with other respiratory manifestations: Secondary | ICD-10-CM

## 2025-02-01 LAB — POCT INFLUENZA A/B
Influenza A, POC: NEGATIVE
Influenza B, POC: NEGATIVE

## 2025-02-01 MED ORDER — PROMETHAZINE-DM 6.25-15 MG/5ML PO SYRP: 5.0000 mL | ORAL_SOLUTION | Freq: Four times a day (QID) | ORAL | 0 refills | Status: AC | PRN

## 2025-02-01 MED ORDER — ONDANSETRON 4 MG PO TBDP: 4.0000 mg | ORAL_TABLET | Freq: Three times a day (TID) | ORAL | 0 refills | Status: AC | PRN

## 2025-02-01 NOTE — ED Provider Notes (Signed)
 " Dustin Frank    CSN: [id number] Arrival date & time: 02/01/25  0818      History   Chief Complaint Chief Complaint  Patient presents with   Headache   Generalized Body Aches   Cough    HPI Dustin Frank is a 34 y.o. male.   HPI  History obtained from the patient. [name] presents for chills, hot flashes, headache, nausea, body aches and cough that started 3 days ago.  This morning, he started vomiting. Says that his kids had the flu last weekend.  Took Goodypowder for headache and NyQuil to help him sleep. No known fever. Has some diarrhea but nothing crazy.    Advith smokes. No history of asthma.         Past Medical History:  Diagnosis Date   Hypertension     There are no active problems to display for this patient.   History reviewed. No pertinent surgical history.     Home Medications    Prior to Admission medications  Medication Sig Start Date End Date Taking? Authorizing Provider  lisinopril  (ZESTRIL ) 20 MG tablet Take 1 tablet (20 mg total) by mouth daily. 10/20/24 02/01/25 Yes Cook, Jayce G, DO  ondansetron  (ZOFRAN -ODT) 4 MG disintegrating tablet Take 1 tablet (4 mg total) by mouth every 8 (eight) hours as needed. 02/01/25  Yes Gennell How, DO  promethazine -dextromethorphan (PROMETHAZINE -DM) 6.25-15 MG/5ML syrup Take 5 mLs by mouth 4 (four) times daily as needed. 02/01/25  Yes Naleyah Ohlinger, DO  meloxicam  (MOBIC ) 15 MG tablet Take 1 tablet (15 mg total) by mouth daily as needed for pain. 10/20/24   Cook, Jayce G, DO    Family History History reviewed. No pertinent family history.  Social History Social History[1]   Allergies   Sulfa antibiotics   Review of Systems Review of Systems: negative unless otherwise stated in HPI.      Physical Exam Triage Vital Signs ED Triage Vitals [02/01/25 0831]  Encounter Vitals Group     BP      Girls Systolic BP Percentile      Girls Diastolic BP Percentile      Boys Systolic  BP Percentile      Boys Diastolic BP Percentile      Pulse      Resp      Temp      Temp src      SpO2      Weight      Height      Head Circumference      Peak Flow      Pain Score 6     Pain Loc      Pain Education      Exclude from Growth Chart    No data found.  Updated Vital Signs BP (!) 146/110 (BP Location: Left Arm)   Pulse 93   Temp 98.1 F (36.7 C) (Oral)   Resp 15   Ht 5' 4 (1.626 m)   Wt 83 kg   SpO2 99%   BMI 31.41 kg/m   Visual Acuity Right Eye Distance:   Left Eye Distance:   Bilateral Distance:    Right Eye Near:   Left Eye Near:    Bilateral Near:     Physical Exam GEN:     alert, non-toxic appearing male in no distress    HENT:  mucus membranes moist, oropharyngeal without lesions, mild erythema, no tonsillar hypertrophy or exudates, no nasal discharge,   EYES:  no scleral injection or discharge NECK:  normal ROM, no lymphadenopathy, no meningismus   RESP:  no increased work of breathing, clear to auscultation bilaterally CVS:   regular rate and rhythm Skin:   warm and dry    UC Treatments / Results  Labs (all labs ordered are listed, but only abnormal results are displayed) Labs Reviewed  POCT INFLUENZA A/B - Normal    EKG   Radiology No results found.   Procedures Procedures (including critical Frank time)  Medications Ordered in UC Medications - No data to display  Initial Impression / Assessment and Plan / UC Course  I have reviewed the triage vital signs and the nursing notes.  Pertinent labs & imaging results that were available during my Frank of the patient were reviewed by me and considered in my medical decision making (see chart for details).       Pt is a 34 y.o. male who presents for 3 days of respiratory symptoms. Dustin Frank is afebrile here without recent antipyretics. Satting well on room air. Declined Zofran  here. Overall pt is non-toxic appearing, well hydrated, without respiratory distress. Pulmonary  exam is unremarkable.  POC influenza obtained and was negative.   Suspect viral respiratory illness. Discussed symptomatic treatment.  Promethazine  DM for cough. Explained lack of efficacy of antibiotics in viral disease.  Typical duration of symptoms discussed. Zofran  for nausea and vomiting. Bland diet for 48 hours.    Return and ED precautions given and voiced understanding. Discussed MDM, treatment plan and plan for follow-up with patient who agrees with plan.     Final Clinical Impressions(s) / UC Diagnoses   Final diagnoses:  Exposure to influenza  Influenza-like illness     Discharge Instructions      Your  influenza is negative. You most likely have a viral respiratory infection that will gradually improve over the next 7-10 days. Cough may last up to 3 weeks. If your were prescribed medication, stop by the pharmacy to pick them up.  You can take Tylenol and/or Ibuprofen as needed for fever reduction and pain relief.    For cough: honey 1/2 to 1 teaspoon (you can dilute the honey in water or another fluid).  Stop at the pharmacy to pick up your prescription cough medication. You can use a humidifier for chest congestion and cough.  If you don't have a humidifier, you can sit in the bathroom with the hot shower running.      For sore throat: try warm salt water gargles, Mucinex sore throat cough drops or cepacol lozenges, throat spray, warm tea or water with lemon/honey, popsicles or ice, or OTC cold relief medicine for throat discomfort. You can also purchase chloraseptic spray at the pharmacy or dollar store.   It is important to stay hydrated: drink plenty of fluids (water, gatorade/powerade/pedialyte, juices, or teas) to keep your throat moisturized and help further relieve irritation/discomfort.    Return or go to the Emergency Department if symptoms worsen or do not improve in the next few days        ED Prescriptions     Medication Sig Dispense Auth. Provider    promethazine -dextromethorphan (PROMETHAZINE -DM) 6.25-15 MG/5ML syrup Take 5 mLs by mouth 4 (four) times daily as needed. 118 mL Dustin Ticas, DO   ondansetron  (ZOFRAN -ODT) 4 MG disintegrating tablet Take 1 tablet (4 mg total) by mouth every 8 (eight) hours as needed. 20 tablet Dustin Balding, DO      PDMP not reviewed this encounter.     [  1]  Social History Tobacco Use   Smoking status: Every Day    Current packs/day: 1.00    Types: Cigarettes   Smokeless tobacco: Never  Vaping Use   Vaping status: Never Used  Substance Use Topics   Alcohol use: No   Drug use: No     Kriste Berth, DO 02/01/25 0903  "

## 2025-02-01 NOTE — Discharge Instructions (Addendum)
 Your  influenza is negative. You most likely have a viral respiratory infection that will gradually improve over the next 7-10 days. Cough may last up to 3 weeks. If your were prescribed medication, stop by the pharmacy to pick them up.  You can take Tylenol and/or Ibuprofen as needed for fever reduction and pain relief.    For cough: honey 1/2 to 1 teaspoon (you can dilute the honey in water or another fluid).  Stop at the pharmacy to pick up your prescription cough medication. You can use a humidifier for chest congestion and cough.  If you don't have a humidifier, you can sit in the bathroom with the hot shower running.      For sore throat: try warm salt water gargles, Mucinex sore throat cough drops or cepacol lozenges, throat spray, warm tea or water with lemon/honey, popsicles or ice, or OTC cold relief medicine for throat discomfort. You can also purchase chloraseptic spray at the pharmacy or dollar store.   It is important to stay hydrated: drink plenty of fluids (water, gatorade/powerade/pedialyte, juices, or teas) to keep your throat moisturized and help further relieve irritation/discomfort.    Return or go to the Emergency Department if symptoms worsen or do not improve in the next few days

## 2025-02-01 NOTE — ED Triage Notes (Signed)
 Patient c/o headache, cough, chills, and bodyaches that started on Wed.  Patient states that his kids had the flu over the weekend.  Patient denies fevers.  Patient some N/V this morning.
